# Patient Record
Sex: Female | Born: 1940 | Race: White | Hispanic: No | Marital: Married | Smoking: Never smoker
Health system: Southern US, Community
[De-identification: ages and names within clinical notes are randomized; demographics above are authoritative.]

## PROBLEM LIST (undated history)

## (undated) DIAGNOSIS — H3530 Unspecified macular degeneration: Secondary | ICD-10-CM

## (undated) HISTORY — DX: Unspecified macular degeneration: H35.30

---

## 1993-12-26 HISTORY — PX: CATARACT EXTRACTION W/PHACO: SHX586

## 2009-06-20 ENCOUNTER — Ambulatory Visit: Payer: Self-pay | Admitting: Family Medicine

## 2010-10-27 ENCOUNTER — Ambulatory Visit: Payer: Self-pay | Admitting: Family Medicine

## 2010-10-27 DIAGNOSIS — R059 Cough, unspecified: Secondary | ICD-10-CM | POA: Insufficient documentation

## 2010-10-27 DIAGNOSIS — R05 Cough: Secondary | ICD-10-CM

## 2010-10-27 DIAGNOSIS — J069 Acute upper respiratory infection, unspecified: Secondary | ICD-10-CM | POA: Insufficient documentation

## 2010-10-27 DIAGNOSIS — E785 Hyperlipidemia, unspecified: Secondary | ICD-10-CM | POA: Insufficient documentation

## 2011-01-25 NOTE — Assessment & Plan Note (Signed)
Summary: CHEST COLD (rm 2)   Vital Signs:  Patient Profile:   70 Years Old Female CC:      Cold & URI symptoms  Height:     54 inches Weight:      183 pounds O2 Sat:      96 % O2 treatment:    Room Air Temp:     97.7 degrees F oral Pulse rate:   100 / minute Resp:     14 per minute BP sitting:   122 / 78  (right arm) Cuff size:   regular  Pt. in pain?   no  Vitals Entered By: Lajean Saver RN (October 27, 2010 11:05 AM)                   Updated Prior Medication List: TOPROL XL 50 MG XR24H-TAB (METOPROLOL SUCCINATE)  LIPITOR 20 MG TABS (ATORVASTATIN CALCIUM)  FISH OIL 1000 MG CAPS (OMEGA-3 FATTY ACIDS)  ROBITUSSIN DM 100-10 MG/5ML SYRP (DEXTROMETHORPHAN-GUAIFENESIN)   Current Allergies (reviewed today): ! SULFAHistory of Present Illness Chief Complaint: Cold & URI symptoms  History of Present Illness:  Subjective:  Patient complains of developing a mild cough and URI symptoms on Sept 26.  Symptoms generally improved and she felt better after two weeks, but has had a persistent mild cough ever since.  About 3 or 4 days ago, the cough increased somewhat and she had sweats last night.  No sore throat, and minimal nasal congestion.  Cough is productive and sputum slightly yellow.  No shortness of breath or pleuritic pain.  Not a smoker.  Denies swelling in legs.  REVIEW OF SYSTEMS Constitutional Symptoms       Complains of night sweats.     Denies fever, chills, weight loss, weight gain, and fatigue.  Eyes       Denies change in vision, eye pain, eye discharge, glasses, contact lenses, and eye surgery. Ear/Nose/Throat/Mouth       Complains of hoarseness.      Denies hearing loss/aids, change in hearing, ear pain, ear discharge, dizziness, frequent runny nose, frequent nose bleeds, sinus problems, sore throat, and tooth pain or bleeding.  Respiratory       Complains of dry cough, productive cough, and wheezing.      Denies shortness of breath, asthma, bronchitis, and  emphysema/COPD.  Cardiovascular       Denies murmurs, chest pain, and tires easily with exhertion.    Gastrointestinal       Denies stomach pain, nausea/vomiting, diarrhea, constipation, blood in bowel movements, and indigestion. Genitourniary       Denies painful urination, kidney stones, and loss of urinary control. Neurological       Denies paralysis, seizures, and fainting/blackouts. Musculoskeletal       Denies muscle pain, joint pain, joint stiffness, decreased range of motion, redness, swelling, muscle weakness, and gout.  Skin       Denies bruising, unusual mles/lumps or sores, and hair/skin or nail changes.  Psych       Denies mood changes, temper/anger issues, anxiety/stress, speech problems, depression, and sleep problems. Other Comments: Symptoms began about 1 month ago. They resolved for a week and have returned. Her PCP is recently deceased and is scheduled to see her new PCP in [place].   Past History:  Past Medical History: Irregular heart rate ovarian cysts Hyperlipidemia  Past Surgical History: Cataract extraction Lumpectomy: left  Family History: Reviewed history from 06/20/2009 and no changes required. Family History of CAD  Female 1st degree relative <60 Family History Diabetes 1st degree relative  Social History: Reviewed history from 06/20/2009 and no changes required. Married Never Smoked Alcohol use-no Drug use-no   Objective:  Appearance:  Patient appears healthy, stated age, and in no acute distress  Eyes:  Pupils are equal, round, and reactive to light and accomdation.  Extraocular movement is intact.  Conjunctivae are not inflamed.  Ears:  Canals normal.  Tympanic membranes normal.   Nose:  Normal septum.  Normal turbinates, mildly congested.  Maxillary sinus tenderness present.  Pharynx:  Normal  Neck:  Supple.  No adenopathy is present.  No thyromegaly is present.  No JVD Lungs:  Few minimal rhonchi bilaterally.  Breath sounds are  equal.  Heart:  Regular rate and rhythm without murmurs, rubs, or gallops.  Abdomen:  Nontender without masses or hepatosplenomegaly.  Bowel sounds are present.  No CVA or flank tenderness.  Extremities:  No edema.  Pedal pulses are full and equal.  Skin:  No rash  CHEST - 2 VIEW   Comparison: None.   Findings: The lungs are clear.  Mediastinal contours appear normal. The heart is within upper limits of normal.  No acute bony abnormality is seen.   IMPRESSION: No active lung disease.  Borderline cardiomegaly.   . Assessment New Problems: VIRAL URI (ICD-465.9) COUGH (ICD-786.2) HYPERLIPIDEMIA (ICD-272.4)  SUSPECT RECURRENT VIRAL URI.  NO EVIDENCE CHF AT THIS TIME  Plan New Medications/Changes: AZITHROMYCIN 250 MG TABS (AZITHROMYCIN) Two tabs by mouth on day 1, then 1 tab daily on days 2 through 5  #6 tabs x 0, 10/27/2010, Donna Christen MD  New Orders: T-Chest x-ray, 2 views [71020] Est. Patient Level IV [04540] Planning Comments:   Because of persistent cough, will treat with Z-pack, expectorant, cough suppressant at bedtime.  Recommend flu shot when resolved.  Follow-up with PCP if cough does not improve.   The patient and/or caregiver has been counseled thoroughly with regard to medications prescribed including dosage, schedule, interactions, rationale for use, and possible side effects and they verbalize understanding.  Diagnoses and expected course of recovery discussed and will return if not improved as expected or if the condition worsens. Patient and/or caregiver verbalized understanding.  Prescriptions: AZITHROMYCIN 250 MG TABS (AZITHROMYCIN) Two tabs by mouth on day 1, then 1 tab daily on days 2 through 5  #6 tabs x 0   Entered and Authorized by:   Donna Christen MD   Signed by:   Donna Christen MD on 10/27/2010   Method used:   Print then Give to Patient   RxID:   [id number]   Patient Instructions: 1)  May use Mucinex or Robitussin (guaifenesin)  daily for  congestion, and Robitussin DM at bedtime. 2)  Increase fluid intake, rest. 3)  May use Afrin nasal spray (or generic oxymetazoline) twice daily for about 5 days.  Also recommend using saline nasal spray several times daily and/or saline nasal irrigation. 4)  Followup with family doctor if not improving 7 to 10 days.   Orders Added: 1)  T-Chest x-ray, 2 views [71020] 2)  Est. Patient Level IV [08657]

## 2012-06-19 IMAGING — CR DG CHEST 2V
2 series · 2 of 2 positions shown · non-contrast
Comparison: None.

CLINICAL DATA: Cough, shortness of breath

CHEST - 2 VIEW

[view not recorded (1 of 2)]
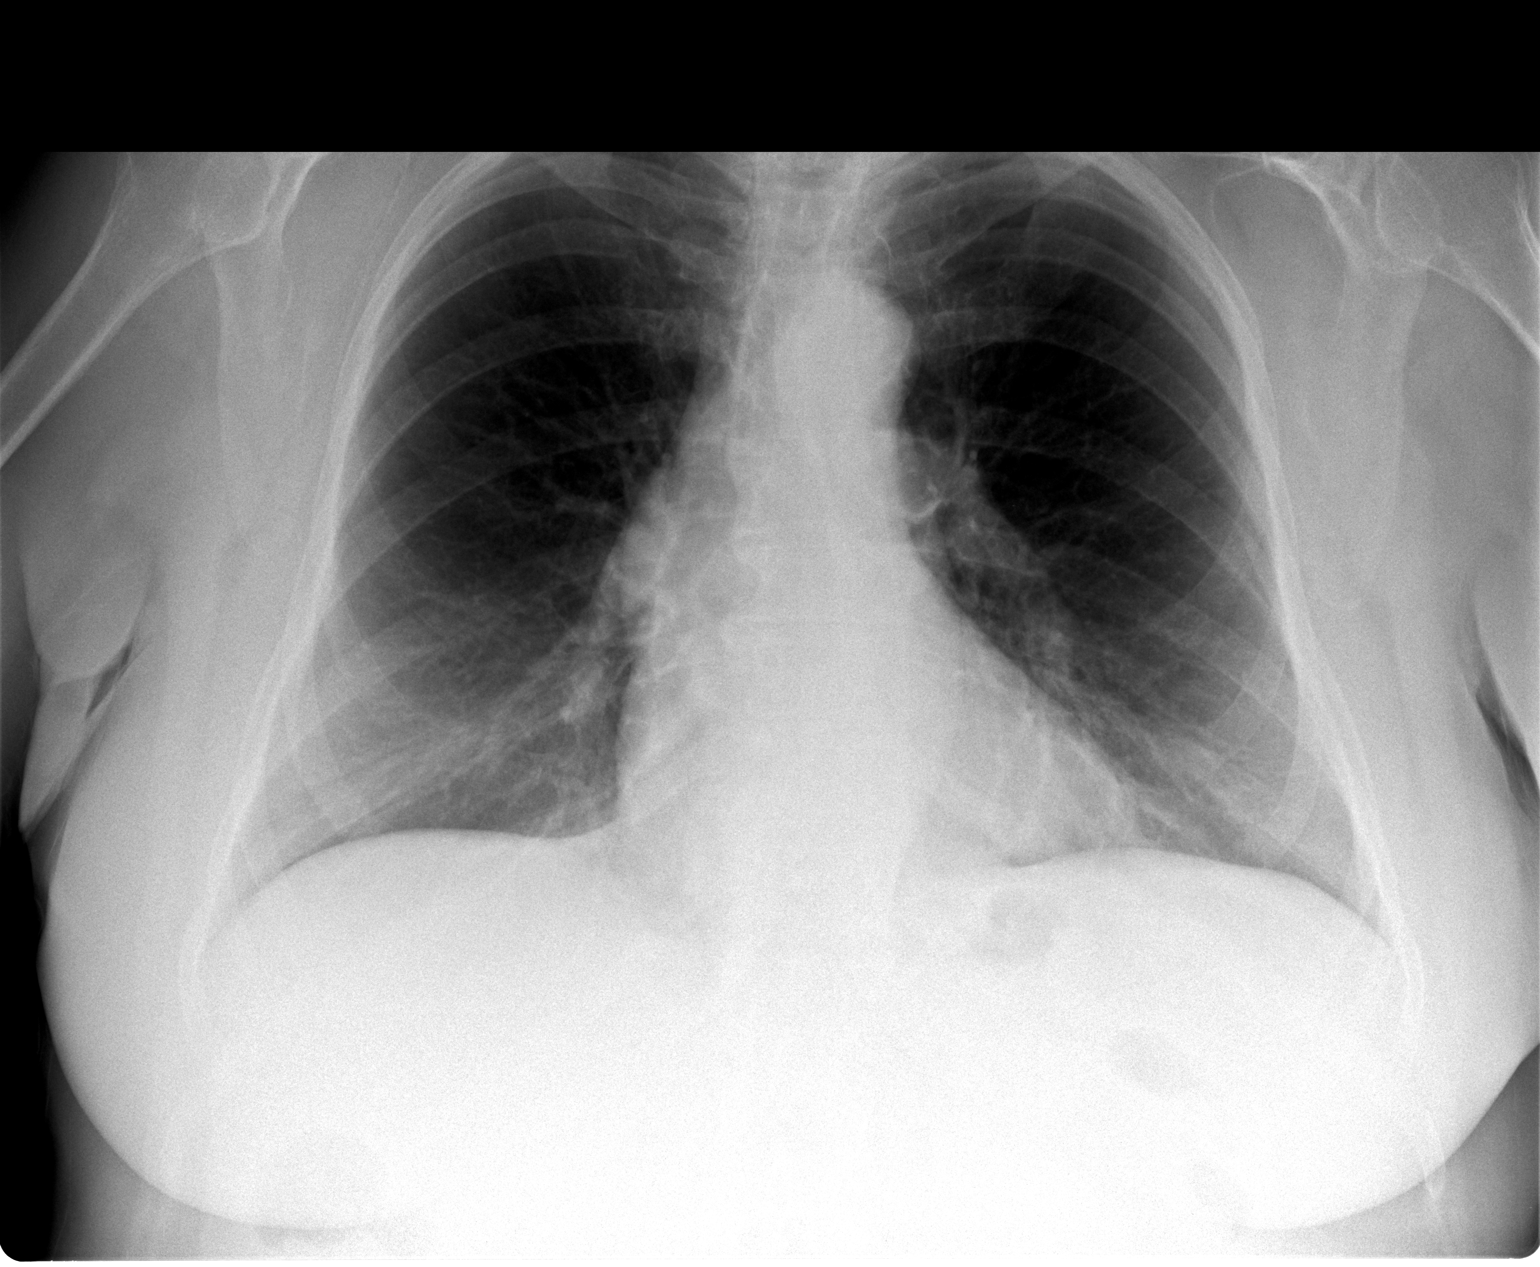

[view not recorded (2 of 2)]
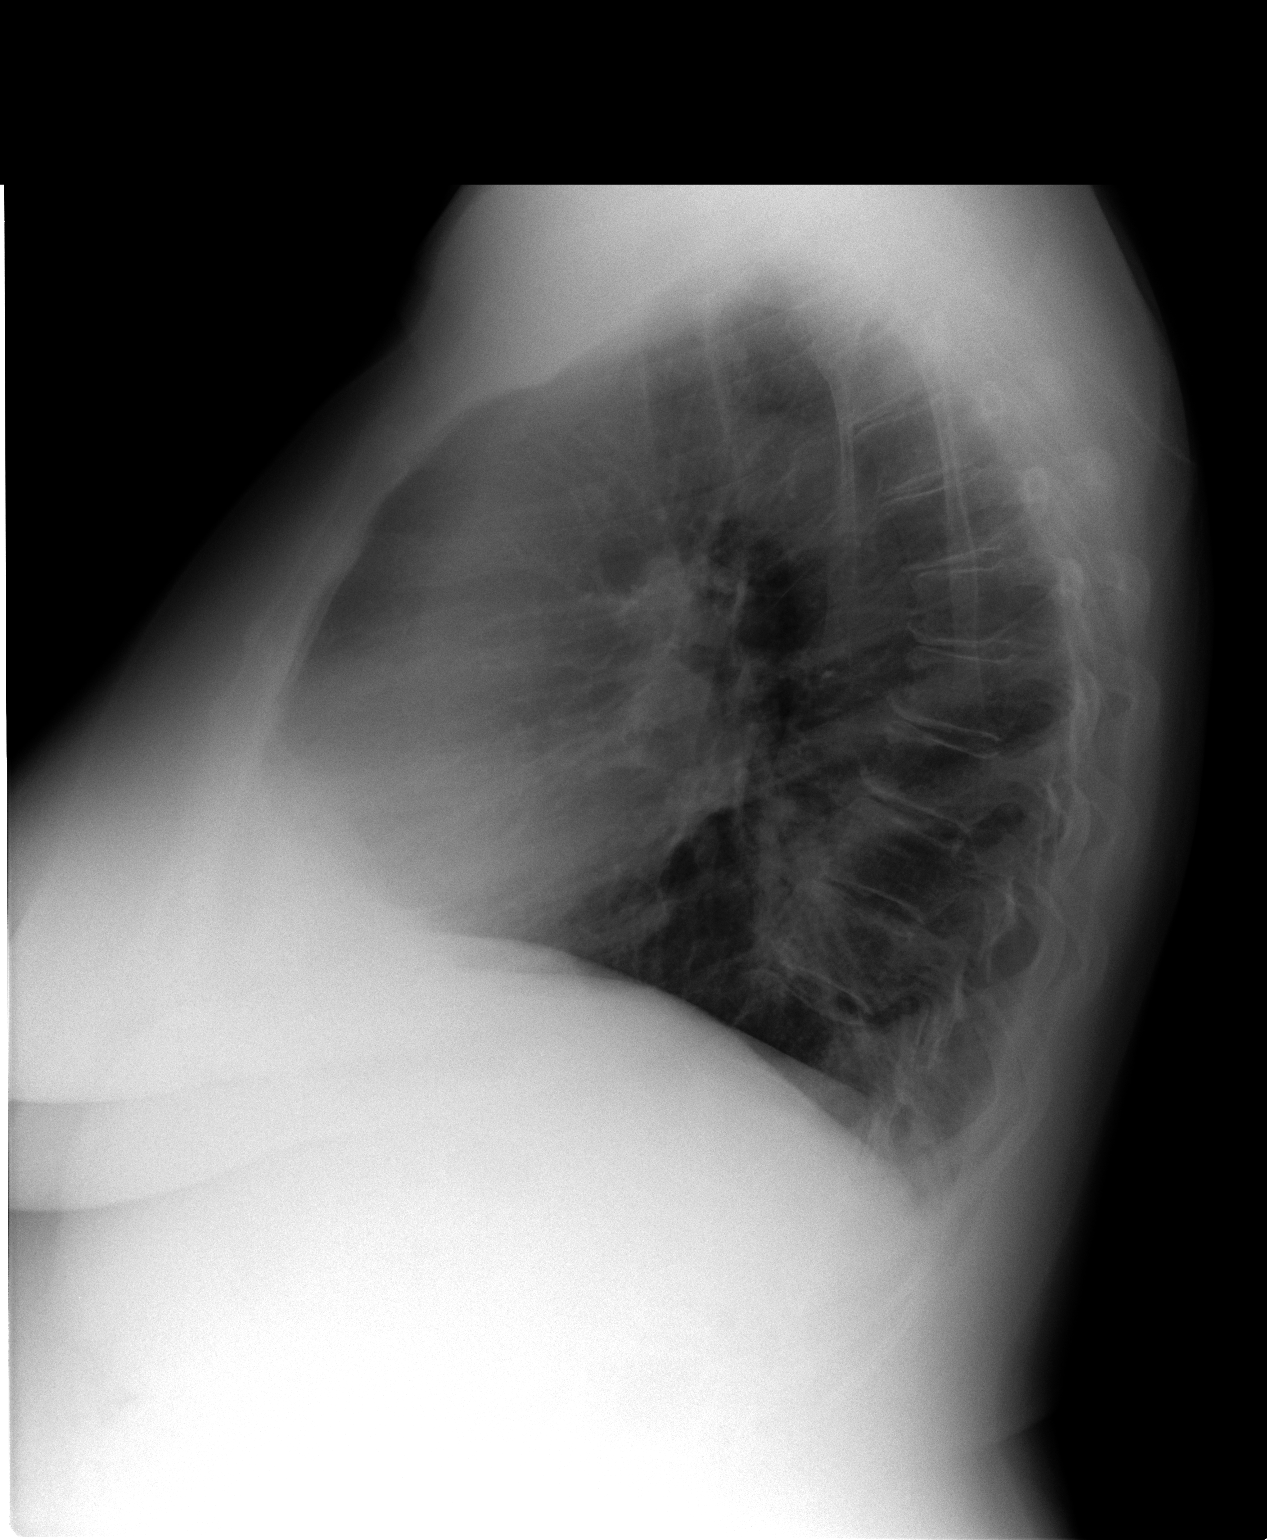

[2 of 2 positions shown; findings below may reference images not displayed]

FINDINGS: The lungs are clear.  Mediastinal contours appear normal.
The heart is within upper limits of normal.  No acute bony
abnormality is seen.
IMPRESSION: No active lung disease.  Borderline cardiomegaly.

## 2013-07-12 ENCOUNTER — Encounter: Payer: Self-pay | Admitting: *Deleted

## 2013-07-15 NOTE — Telephone Encounter (Signed)
This encounter was created in error - please disregard.

## 2020-04-27 ENCOUNTER — Encounter (INDEPENDENT_AMBULATORY_CARE_PROVIDER_SITE_OTHER): Payer: Self-pay | Admitting: Ophthalmology

## 2020-04-27 ENCOUNTER — Ambulatory Visit (INDEPENDENT_AMBULATORY_CARE_PROVIDER_SITE_OTHER): Payer: Medicare HMO | Admitting: Ophthalmology

## 2020-04-27 ENCOUNTER — Other Ambulatory Visit: Payer: Self-pay

## 2020-04-27 DIAGNOSIS — H353223 Exudative age-related macular degeneration, left eye, with inactive scar: Secondary | ICD-10-CM | POA: Diagnosis not present

## 2020-04-27 DIAGNOSIS — H353113 Nonexudative age-related macular degeneration, right eye, advanced atrophic without subfoveal involvement: Secondary | ICD-10-CM

## 2020-04-27 DIAGNOSIS — H35051 Retinal neovascularization, unspecified, right eye: Secondary | ICD-10-CM | POA: Diagnosis not present

## 2020-04-27 DIAGNOSIS — H353114 Nonexudative age-related macular degeneration, right eye, advanced atrophic with subfoveal involvement: Secondary | ICD-10-CM | POA: Insufficient documentation

## 2020-04-27 DIAGNOSIS — H1132 Conjunctival hemorrhage, left eye: Secondary | ICD-10-CM

## 2020-04-27 DIAGNOSIS — H353122 Nonexudative age-related macular degeneration, left eye, intermediate dry stage: Secondary | ICD-10-CM | POA: Diagnosis not present

## 2020-04-27 HISTORY — DX: Conjunctival hemorrhage, left eye: H11.32

## 2020-04-27 NOTE — Patient Instructions (Signed)
Age-Related Macular Degeneration  Age-related macular degeneration (AMD) is an eye disease related to aging. The disease causes a loss of central vision. Central vision allows a person to see objects clearly and do daily tasks like reading and driving. There are two main types of AMD:  Dry AMD. People with this type generally lose their vision slowly. This is the most common type of AMD. Some people with dry AMD notice very little change in their vision as they age.  Wet AMD. People with this type can lose their vision quickly. What are the causes? This condition is caused by damage to the part of the eye that provides you with central vision (macula).  Dry AMD happens when deposits in the macula cause light-sensitive cells to slowly break down.  Wet AMD happens when abnormal blood vessels grow under the macula and leak blood and fluid. What increases the risk? You are more likely to develop this condition if you:  Are 50 years old or older, and especially 75 years old or older.  Smoke.  Are obese.  Have a family history of AMD.  Have high cholesterol, high blood pressure, or heart disease.  Have been exposed to high levels of ultraviolet (UV) light and blue light.  Are white (Caucasian).  Are female. What are the signs or symptoms? Common symptoms of this condition include:  Blurred vision, especially when reading print material. The blurred vision often improves in brighter light.  A blurred or blind spot in the center of your field of vision that is small but growing larger.  Bright colors seeming less bright than they used to be.  Decreased ability to recognize and see faces.  One eye seeing worse than the other.  Decreased ability to adapt to dimly lit rooms.  Straight lines appearing crooked or wavy. How is this diagnosed? This condition is diagnosed based on your symptoms and an eye exam. During the eye exam:  Eye drops will be placed into your eyes to  enlarge (dilate) your pupils. This will allow your health care provider to see the back of your eye.  You may be asked to look at an image that looks like a checkerboard (Amsler grid). Early changes in your central vision may cause the grid to appear distorted. After the exam, you may be given one or both of these tests:  Fluorescein angiogram. This test determines whether you have dry or wet AMD.  Optical coherence tomography (OCT) test to evaluate deep layers of the retina. How is this treated? There is no cure for this condition, but treatment can help to slow down progression of the disease. This condition may be treated with:  Supplements, including vitamin C, vitamin E, beta carotene, and zinc.  Laser surgery to destroy new blood vessels or leaking blood vessels in your eye.  Injections of medicines into your eye to slow down the formation of abnormal blood vessels that may leak. These injections may need to be repeated on a routine basis. Follow these instructions at home:  Take over-the-counter and prescription medicines only as told by your health care provider.  Take vitamins and supplements as told by your health care provider.  Ask your health care provider for an Amsler grid. Use it every day to check each eye for vision changes.  Get an eye exam as often as told by your health care provider. Make sure to get an eye exam at least once every year.  Keep all follow-up visits as told by   your health care provider. This is important. Contact a health care provider if:  You notice any new changes in your vision. Get help right away if:  You suddenly lose vision or develop pain in the eye. Summary  Age-related macular degeneration (AMD) is an eye disease related to aging. There are two types of this condition: dry AMD and wet AMD.  This condition is caused by damage to the part of the eye that provides you with central vision (macula).  Once diagnosed with AMD, make sure  to get an eye exam every year, take supplements and vitamins as directed, use an Amsler grid at home, and follow up with your health care provider. This information is not intended to replace advice given to you by your health care provider. Make sure you discuss any questions you have with your health care provider. Document Revised: 06/20/2018 Document Reviewed: 06/20/2018 [employer] Patient Education  2020 [employer]  

## 2020-04-27 NOTE — Assessment & Plan Note (Signed)
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
## 2020-06-08 ENCOUNTER — Encounter (INDEPENDENT_AMBULATORY_CARE_PROVIDER_SITE_OTHER): Payer: Self-pay | Admitting: Ophthalmology

## 2020-10-28 ENCOUNTER — Other Ambulatory Visit: Payer: Self-pay

## 2020-10-28 ENCOUNTER — Ambulatory Visit (INDEPENDENT_AMBULATORY_CARE_PROVIDER_SITE_OTHER): Payer: Medicare HMO | Admitting: Ophthalmology

## 2020-10-28 ENCOUNTER — Encounter (INDEPENDENT_AMBULATORY_CARE_PROVIDER_SITE_OTHER): Payer: Self-pay | Admitting: Ophthalmology

## 2020-10-28 DIAGNOSIS — H353114 Nonexudative age-related macular degeneration, right eye, advanced atrophic with subfoveal involvement: Secondary | ICD-10-CM

## 2020-10-28 DIAGNOSIS — H353113 Nonexudative age-related macular degeneration, right eye, advanced atrophic without subfoveal involvement: Secondary | ICD-10-CM

## 2020-10-28 DIAGNOSIS — H353223 Exudative age-related macular degeneration, left eye, with inactive scar: Secondary | ICD-10-CM

## 2020-10-28 DIAGNOSIS — H353122 Nonexudative age-related macular degeneration, left eye, intermediate dry stage: Secondary | ICD-10-CM

## 2020-10-28 NOTE — Assessment & Plan Note (Signed)
Accounts for acuity OD, no signs of CNVM

## 2020-10-28 NOTE — Assessment & Plan Note (Signed)
No signs of CNVM recurrence OS

## 2020-10-28 NOTE — Patient Instructions (Signed)
Patient may follow-up in [place] with [facility] covering Dr. Philis Fendt or one of his partners as she may be moving in that locale

## 2020-10-28 NOTE — Progress Notes (Signed)
10/28/2020     CHIEF COMPLAINT Patient presents for Retina Follow Up   HISTORY OF PRESENT ILLNESS: Gina Cohen is a 79 y.o. female who presents to the clinic today for:   HPI    Retina Follow Up    Patient presents with  Dry AMD.  In both eyes.  Severity is moderate.  Duration of 6 months.  Since onset it is stable.  I, the attending physician,  performed the HPI with the patient and updated documentation appropriately.          Comments    6 Month Dry AMD f\u OU. OCT  Pt states no changes in vision. Denies FOL and floaters.       Last edited by Elyse Jarvis on 10/28/2020 10:48 AM. (History)      Referring physician: No referring provider defined for this encounter.  HISTORICAL INFORMATION:   Selected notes from the MEDICAL RECORD NUMBER       CURRENT MEDICATIONS: No current outpatient medications on file. (Ophthalmic Drugs)   No current facility-administered medications for this visit. (Ophthalmic Drugs)   Current Outpatient Medications (Other)  Medication Sig   anastrozole (ARIMIDEX) 1 MG tablet Take by mouth.   calcium-vitamin D (OSCAL WITH D) 500-200 MG-UNIT TABS tablet Take by mouth.   co-enzyme Q-10 30 MG capsule Take by mouth.   cyanocobalamin 1000 MCG tablet Take by mouth.   denosumab (PROLIA) 60 MG/ML SOSY injection Inject into the skin.   estradiol (ESTRACE) 0.1 MG/GM vaginal cream Place a pea-sized amount in the vagina every night for 3 weeks then every other night after that   flecainide (TAMBOCOR) 100 MG tablet Take by mouth.   fluocinonide cream (LIDEX) 0.05 % APPLY TO ITCHING AREA ON LEG ONCE PER DAY UNDER OCCLUSION, NOT TO FACE   hydrocortisone 2.5 % cream Apply 1-2 times daily for 2 weeks, for hemorrhoid.   metoprolol succinate (TOPROL-XL) 25 MG 24 hr tablet Take by mouth.   pravastatin (PRAVACHOL) 80 MG tablet Take by mouth.   sodium chloride (OCEAN) 0.65 % nasal spray Place into the nose.   No current  facility-administered medications for this visit. (Other)      REVIEW OF SYSTEMS:    ALLERGIES Allergies  Allergen Reactions   Sulfonamide Derivatives    Tobramycin     PAST MEDICAL HISTORY Past Medical History:  Diagnosis Date   Macular degeneration    Past Surgical History:  Procedure Laterality Date   CATARACT EXTRACTION W/PHACO Bilateral 1995    FAMILY HISTORY Family History  Problem Relation Age of Onset   Macular degeneration Mother    Diabetes Mother    Hypertension Mother    Macular degeneration Maternal Grandmother    Diabetes Maternal Grandmother     SOCIAL HISTORY Social History   Tobacco Use   Smoking status: Never Smoker   Smokeless tobacco: Never Used  Substance Use Topics   Alcohol use: Not on file   Drug use: Not on file         OPHTHALMIC EXAM: Base Eye Exam    Visual Acuity (Snellen - Linear)      Right Left   Dist [place] 20/400 20/30 +   Dist ph [place] 20/200        Tonometry (Tonopen, 10:52 AM)      Right Left   Pressure 13 16       Pupils      Pupils Dark Light Shape React APD   Right PERRL  4 3 Round Brisk None   Left PERRL 4 3 Round Brisk None       Visual Fields (Counting fingers)      Left Right    Full Full       Neuro/Psych    Oriented x3: Yes   Mood/Affect: Normal       Dilation    Both eyes: 1.0% Mydriacyl, 2.5% Phenylephrine @ 10:52 AM        Slit Lamp and Fundus Exam    External Exam      Right Left   External Normal Normal       Slit Lamp Exam      Right Left   Lids/Lashes Normal Normal   Conjunctiva/Sclera White and quiet 1+ Subconjunctival hemorrhage, inferonasal quadrant, nonbullous   Cornea Clear Clear   Anterior Chamber Deep and quiet Deep and quiet   Iris Round and reactive Round and reactive   Lens Posterior chamber intraocular lens Posterior chamber intraocular lens   Anterior Vitreous Normal Normal       Fundus Exam      Right Left   Posterior Vitreous Normal Normal    Disc Normal Normal   C/D Ratio 0.6 0.6   Macula Advanced age related macular degeneration, Macular atrophy, Geographic atrophy in the FAZ Advanced age related macular degeneration, Intermediate age related macular degeneration, Retinal pigment epithelial atrophy, Geographic atrophy peri around the fovea, Drusen, Hard drusen, no exudates, no macular thickening, Retinal pigment epithelial mottling   Vessels Normal Normal   Periphery Normal Normal          IMAGING AND PROCEDURES  Imaging and Procedures for 10/28/20  OCT, Retina - OU - Both Eyes       Right Eye Quality was good. Scan locations included subfoveal. Central Foveal Thickness: 255. Progression has been stable. Findings include normal foveal contour, no SRF, central retinal atrophy, outer retinal atrophy, subretinal scarring.   Left Eye Quality was good. Scan locations included subfoveal. Central Foveal Thickness: 274. Progression has been stable. Findings include normal foveal contour, no SRF, outer retinal atrophy, central retinal atrophy, inner retinal atrophy.   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 Intermediate stage nonexudative age-related macular degeneration of left eye  H35.3122 OCT, Retina - OU - Both Eyes  3. 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  No signs of active wet AMD  OU.  2.  Atrophy central fovea  OD accounts for acuity, no change  3.  Ophthalmic Meds Ordered this visit:  No orders of the defined types were placed in this encounter.      Return in about 9 months (around 07/28/2021) for DILATE OU, OCT.  Patient Instructions  Patient may follow-up in [place] with [facility] covering Dr. Philis Fendt or one of his partners as she may be moving  in that locale    Explained the diagnoses, plan, and follow up with the patient and they expressed understanding.  Patient expressed understanding of the importance of proper follow up care.   Alford Highland Madoc Holquin M.D. Diseases & Surgery of the Retina and Vitreous [facility] 10/28/20     Abbreviations: M myopia (nearsighted); A astigmatism; H hyperopia (farsighted); P presbyopia; Mrx spectacle prescription;  CTL contact lenses; OD right eye; OS left eye; OU both eyes  XT exotropia; ET esotropia; PEK punctate epithelial keratitis; PEE punctate epithelial erosions; DES dry eye syndrome; MGD meibomian gland dysfunction; ATs artificial tears; PFAT's preservative free artificial tears; NSC nuclear sclerotic cataract; PSC posterior subcapsular cataract; ERM epi-retinal membrane; PVD posterior vitreous detachment; RD retinal detachment; DM diabetes mellitus; DR diabetic retinopathy; NPDR non-proliferative diabetic retinopathy; PDR proliferative diabetic retinopathy; CSME clinically significant macular edema; DME diabetic macular edema; dbh dot blot hemorrhages; CWS cotton wool spot; POAG primary open angle glaucoma; C/D cup-to-disc ratio; HVF humphrey visual field; GVF goldmann visual field; OCT optical coherence tomography; IOP intraocular pressure; BRVO Branch retinal vein occlusion; CRVO central retinal vein occlusion; CRAO central retinal artery occlusion; BRAO branch retinal artery occlusion; RT retinal tear; SB scleral buckle; PPV pars plana vitrectomy; VH Vitreous hemorrhage; PRP panretinal laser  photocoagulation; IVK intravitreal kenalog; VMT vitreomacular traction; MH Macular hole;  NVD neovascularization of the disc; NVE neovascularization elsewhere; AREDS age related eye disease study; ARMD age related macular degeneration; POAG primary open angle glaucoma; EBMD epithelial/anterior basement membrane dystrophy; ACIOL anterior chamber intraocular lens; IOL intraocular lens; PCIOL posterior chamber intraocular lens; Phaco/IOL phacoemulsification with intraocular lens placement; PRK photorefractive keratectomy; LASIK laser assisted in situ keratomileusis; HTN hypertension; DM diabetes mellitus; COPD chronic obstructive pulmonary disease

## 2021-07-28 ENCOUNTER — Encounter (INDEPENDENT_AMBULATORY_CARE_PROVIDER_SITE_OTHER): Payer: Medicare HMO | Admitting: Ophthalmology

## 2021-08-31 ENCOUNTER — Encounter (INDEPENDENT_AMBULATORY_CARE_PROVIDER_SITE_OTHER): Payer: Self-pay | Admitting: Ophthalmology

## 2021-08-31 ENCOUNTER — Other Ambulatory Visit: Payer: Self-pay

## 2021-08-31 ENCOUNTER — Ambulatory Visit (INDEPENDENT_AMBULATORY_CARE_PROVIDER_SITE_OTHER): Payer: Medicare HMO | Admitting: Ophthalmology

## 2021-08-31 DIAGNOSIS — H353114 Nonexudative age-related macular degeneration, right eye, advanced atrophic with subfoveal involvement: Secondary | ICD-10-CM | POA: Diagnosis not present

## 2021-08-31 DIAGNOSIS — H353223 Exudative age-related macular degeneration, left eye, with inactive scar: Secondary | ICD-10-CM | POA: Diagnosis not present

## 2021-08-31 DIAGNOSIS — H353122 Nonexudative age-related macular degeneration, left eye, intermediate dry stage: Secondary | ICD-10-CM | POA: Diagnosis not present

## 2021-08-31 DIAGNOSIS — H353113 Nonexudative age-related macular degeneration, right eye, advanced atrophic without subfoveal involvement: Secondary | ICD-10-CM

## 2021-08-31 NOTE — Progress Notes (Signed)
08/31/2021     CHIEF COMPLAINT Patient presents for  Chief Complaint  Patient presents with   Macular Degeneration      HISTORY OF PRESENT ILLNESS: Gina Cohen is a 80 y.o. 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  No interval changes by patient.  No visual acuity distortions or visual loss in either eye Last edited by Edmon Crape, MD on 08/31/2021 10:22 AM.      Referring physician: No referring provider defined for this encounter.  HISTORICAL INFORMATION:   Selected notes from the MEDICAL RECORD NUMBER       CURRENT MEDICATIONS: No current outpatient medications on file. (Ophthalmic Drugs)   No current facility-administered medications for this visit. (Ophthalmic Drugs)   Current Outpatient Medications (Other)  Medication Sig   anastrozole (ARIMIDEX) 1 MG tablet Take by mouth.   calcium-vitamin D (OSCAL WITH D) 500-200 MG-UNIT TABS tablet Take by mouth.   co-enzyme Q-10 30 MG capsule Take by mouth.   cyanocobalamin 1000 MCG tablet Take by mouth.   denosumab (PROLIA) 60 MG/ML SOSY injection Inject into the skin.   estradiol (ESTRACE) 0.1 MG/GM vaginal cream Place a pea-sized amount in the vagina every night for 3 weeks then every other night after that   flecainide (TAMBOCOR) 100 MG tablet Take by mouth.   fluocinonide cream (LIDEX) 0.05 % APPLY TO ITCHING AREA ON LEG ONCE PER DAY UNDER OCCLUSION, NOT TO FACE   hydrocortisone 2.5 % cream Apply 1-2 times daily for 2 weeks, for hemorrhoid.   metoprolol succinate (TOPROL-XL) 25 MG 24 hr tablet Take by mouth.   pravastatin (PRAVACHOL) 80 MG tablet Take by mouth.   sodium chloride (OCEAN) 0.65 % nasal spray Place into the nose.   No current facility-administered medications for this visit. (Other)      REVIEW OF SYSTEMS:    ALLERGIES Allergies  Allergen Reactions   Sulfonamide Derivatives    Tobramycin     PAST MEDICAL  HISTORY Past Medical History:  Diagnosis Date   Macular degeneration    Subconjunctival hemorrhage of left eye 04/27/2020   Past Surgical History:  Procedure Laterality Date   CATARACT EXTRACTION W/PHACO Bilateral 1995    FAMILY HISTORY Family History  Problem Relation Age of Onset   Macular degeneration Mother    Diabetes Mother    Hypertension Mother    Macular degeneration Maternal Grandmother    Diabetes Maternal Grandmother     SOCIAL HISTORY Social History   Tobacco Use   Smoking status: Never   Smokeless tobacco: Never         OPHTHALMIC EXAM:  Base Eye Exam     Visual Acuity (ETDRS)       Right Left   Dist [place] 20/300 20/25         Tonometry (Tonopen, 10:20 AM)       Right Left   Pressure 15 14         Pupils       Pupils React APD   Right PERRL Brisk None   Left PERRL Brisk None         Visual Fields       Left Right    Full    Restrictions  Central scotoma         Neuro/Psych     Oriented x3: Yes   Mood/Affect: Normal  Slit Lamp and Fundus Exam     External Exam       Right Left   External Normal Normal         Slit Lamp Exam       Right Left   Lids/Lashes Normal Normal   Conjunctiva/Sclera White and quiet White and quiet   Cornea Clear Clear   Anterior Chamber Deep and quiet Deep and quiet   Iris Round and reactive Round and reactive   Lens Posterior chamber intraocular lens Posterior chamber intraocular lens   Anterior Vitreous Normal Normal         Fundus Exam       Right Left   Posterior Vitreous Normal Normal   Disc Normal Normal   C/D Ratio 0.6 0.6   Macula Advanced age related macular degeneration, Macular atrophy, Geographic atrophy in the FAZ Advanced age related macular degeneration, Intermediate age related macular degeneration, Retinal pigment epithelial atrophy, Geographic atrophy peri around the fovea, Drusen, Hard drusen, no exudates, no macular thickening, Retinal pigment  epithelial mo, , ttling   Vessels Normal Normal   Periphery Normal Normal            IMAGING AND PROCEDURES  Imaging and Procedures for 08/31/21  OCT, Retina - OU - Both Eyes       Right Eye Quality was good. Scan locations included subfoveal. Central Foveal Thickness: 255. Progression has been stable. Findings include normal foveal contour, no SRF, central retinal atrophy, outer retinal atrophy, subretinal scarring.   Left Eye Quality was good. Scan locations included subfoveal. Central Foveal Thickness: 291. Progression has been stable. Findings include normal foveal contour, no SRF, outer retinal atrophy, central retinal atrophy, inner retinal atrophy.   Notes No no active maculopathy OU, no signs of CNVM OU             ASSESSMENT/PLAN:  Advanced nonexudative age-related macular degeneration of right eye with subfoveal involvement The nature of dry age related macular degeneration was discussed with the patient as well as its possible conversion to wet. The results of the AREDS 2 study was discussed with the patient. A diet rich in dark leafy green vegetables was advised and specific recommendations were made regarding supplements with AREDS 2 formulation . Control of hypertension and serum cholesterol may slow the disease. Smoking cessation is mandatory to slow the disease and diminish the risk of progressing to wet age related macular degeneration. The patient was instructed in the use of an [place]ler Grid and was told to return immediately for any changes in the Grid. 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 Advanced nonexudative age-related macular degeneration of right eye without subfoveal involvement  H35.3113 OCT, Retina - OU - Both Eyes     2. Intermediate stage nonexudative age-related macular degeneration of left eye  H35.3122 OCT, Retina - OU - Both Eyes    3. Advanced nonexudative age-related macular degeneration of right eye with subfoveal involvement  H35.3114     4. 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  No interval change over the last 9 months, follow-up again in 9 months  2.  Stable geographic atrophy OD limits acuity  3.  Advanced intermediate ARMD OS yet no sign of CNVM will observe  Ophthalmic Meds Ordered this visit:  No orders of the defined types were placed in this encounter.      Return in about 9 months (around 05/31/2022) for DILATE OU, COLOR FP, OCT, OD, OS.  There are no Patient Instructions on file for this visit.   Explained the diagnoses, plan, and follow up with the patient and they expressed understanding.  Patient expressed understanding of the importance of proper follow up care.   Alford Highland Claudia Alvizo M.D. Diseases & Surgery of the Retina and Vitreous [facility] 08/31/21     Abbreviations: M myopia (nearsighted); A astigmatism; H hyperopia (farsighted); P presbyopia; Mrx spectacle prescription;  CTL contact lenses; OD right eye; OS left eye; OU both eyes  XT exotropia; ET esotropia; PEK punctate epithelial keratitis; PEE punctate epithelial erosions; DES dry eye syndrome; MGD meibomian gland dysfunction; ATs artificial tears; PFAT's preservative free artificial tears; NSC nuclear sclerotic cataract; PSC posterior subcapsular cataract; ERM epi-retinal membrane; PVD posterior vitreous detachment; RD retinal detachment; DM diabetes mellitus; DR diabetic retinopathy; NPDR non-proliferative diabetic retinopathy; PDR proliferative diabetic retinopathy; CSME clinically significant macular edema; DME diabetic macular edema; dbh dot blot hemorrhages; CWS cotton wool spot; POAG primary open angle glaucoma; C/D cup-to-disc  ratio; HVF humphrey visual field; GVF goldmann visual field; OCT optical coherence tomography; IOP intraocular pressure; BRVO Branch retinal vein occlusion; CRVO central retinal vein occlusion; CRAO central retinal artery occlusion; BRAO branch retinal artery occlusion; RT retinal tear; SB scleral buckle; PPV pars plana vitrectomy; VH Vitreous hemorrhage; PRP panretinal laser photocoagulation; IVK intravitreal kenalog; VMT vitreomacular traction; MH Macular hole;  NVD neovascularization of the disc; NVE neovascularization elsewhere; AREDS age related eye disease study; ARMD age related macular degeneration; POAG primary open angle glaucoma; EBMD epithelial/anterior basement membrane dystrophy; ACIOL anterior chamber intraocular lens; IOL intraocular lens; PCIOL posterior chamber intraocular lens; Phaco/IOL phacoemulsification with intraocular lens placement; PRK photorefractive keratectomy; LASIK laser assisted in situ keratomileusis; HTN hypertension; DM diabetes mellitus; COPD chronic obstructive pulmonary disease

## 2021-08-31 NOTE — Assessment & Plan Note (Signed)
No signs of CNVM today OS

## 2021-08-31 NOTE — Assessment & Plan Note (Signed)
Stable and in active disease

## 2021-08-31 NOTE — Assessment & Plan Note (Signed)

## 2022-05-31 ENCOUNTER — Encounter (INDEPENDENT_AMBULATORY_CARE_PROVIDER_SITE_OTHER): Payer: Self-pay | Admitting: Ophthalmology

## 2022-05-31 ENCOUNTER — Ambulatory Visit (INDEPENDENT_AMBULATORY_CARE_PROVIDER_SITE_OTHER): Payer: Medicare HMO | Admitting: Ophthalmology

## 2022-05-31 DIAGNOSIS — H353113 Nonexudative age-related macular degeneration, right eye, advanced atrophic without subfoveal involvement: Secondary | ICD-10-CM | POA: Diagnosis not present

## 2022-05-31 DIAGNOSIS — H353114 Nonexudative age-related macular degeneration, right eye, advanced atrophic with subfoveal involvement: Secondary | ICD-10-CM | POA: Diagnosis not present

## 2022-05-31 DIAGNOSIS — H353123 Nonexudative age-related macular degeneration, left eye, advanced atrophic without subfoveal involvement: Secondary | ICD-10-CM | POA: Diagnosis not present

## 2022-05-31 NOTE — Assessment & Plan Note (Signed)
Large increased size overall but stable over the last 9 months subfoveal atrophy

## 2022-05-31 NOTE — Progress Notes (Signed)
05/31/2022     CHIEF COMPLAINT Patient presents for  Chief Complaint  Patient presents with   Macular Degeneration      HISTORY OF PRESENT ILLNESS: Gina Cohen is a 81 y.o. female who presents to the clinic today for:   HPI   9 mos fu OU OCT FP. Patient states "My vision seems like it is not as good as it was, but just generally speaking." Denies new FOL or floaters. Allergy to Tobramycin. Last edited by Nelva Nay on 05/31/2022  9:37 AM.      Referring physician: No referring provider defined for this encounter.  HISTORICAL INFORMATION:   Selected notes from the MEDICAL RECORD NUMBER       CURRENT MEDICATIONS: No current outpatient medications on file. (Ophthalmic Drugs)   No current facility-administered medications for this visit. (Ophthalmic Drugs)   Current Outpatient Medications (Other)  Medication Sig   anastrozole (ARIMIDEX) 1 MG tablet Take by mouth.   calcium-vitamin D (OSCAL WITH D) 500-200 MG-UNIT TABS tablet Take by mouth.   co-enzyme Q-10 30 MG capsule Take by mouth.   cyanocobalamin 1000 MCG tablet Take by mouth.   denosumab (PROLIA) 60 MG/ML SOSY injection Inject into the skin.   estradiol (ESTRACE) 0.1 MG/GM vaginal cream Place a pea-sized amount in the vagina every night for 3 weeks then every other night after that   flecainide (TAMBOCOR) 100 MG tablet Take by mouth.   fluocinonide cream (LIDEX) 0.05 % APPLY TO ITCHING AREA ON LEG ONCE PER DAY UNDER OCCLUSION, NOT TO FACE   hydrocortisone 2.5 % cream Apply 1-2 times daily for 2 weeks, for hemorrhoid.   metoprolol succinate (TOPROL-XL) 25 MG 24 hr tablet Take by mouth.   pravastatin (PRAVACHOL) 80 MG tablet Take by mouth.   sodium chloride (OCEAN) 0.65 % nasal spray Place into the nose.   No current facility-administered medications for this visit. (Other)      REVIEW OF SYSTEMS:    ALLERGIES Allergies  Allergen Reactions   Sulfonamide Derivatives    Tobramycin     PAST  MEDICAL HISTORY Past Medical History:  Diagnosis Date   Macular degeneration    Subconjunctival hemorrhage of left eye 04/27/2020   Past Surgical History:  Procedure Laterality Date   CATARACT EXTRACTION W/PHACO Bilateral 1995    FAMILY HISTORY Family History  Problem Relation Age of Onset   Macular degeneration Mother    Diabetes Mother    Hypertension Mother    Macular degeneration Maternal Grandmother    Diabetes Maternal Grandmother     SOCIAL HISTORY Social History   Tobacco Use   Smoking status: Never   Smokeless tobacco: Never         OPHTHALMIC EXAM:  Base Eye Exam     Visual Acuity (ETDRS)       Right Left   Dist [place] 20/250 20/25 -1   Dist ph [place] NI 9:41 AM         Tonometry (Tonopen, 9:42 AM)       Right Left   Pressure 15 17         Pupils       Pupils Dark Light APD   Right PERRL 4 3 None   Left PERRL 4 3 None         Visual Fields (Counting fingers)       Left Right    Full    Restrictions  Partial inner superior temporal, inferior temporal, superior nasal, inferior  nasal deficiencies         Extraocular Movement       Right Left    Full Full         Neuro/Psych     Oriented x3: Yes   Mood/Affect: Normal         Dilation     Both eyes: 1.0% Mydriacyl, 2.5% Phenylephrine @ 9:41 AM           Slit Lamp and Fundus Exam     External Exam       Right Left   External Normal Normal         Slit Lamp Exam       Right Left   Lids/Lashes Normal Normal   Conjunctiva/Sclera White and quiet White and quiet   Cornea Clear Clear   Anterior Chamber Deep and quiet Deep and quiet   Iris Round and reactive Round and reactive   Lens Posterior chamber intraocular lens Posterior chamber intraocular lens   Anterior Vitreous Normal Normal         Fundus Exam       Right Left   Posterior Vitreous Normal Normal   Disc Normal Normal   C/D Ratio 0.6 0.6   Macula Advanced age related macular degeneration, Macular  atrophy, Geographic atrophy in the FAZ Advanced age related macular degeneration, Intermediate age related macular degeneration, Retinal pigment epithelial atrophy, Geographic atrophy peri around the fovea, Drusen, Hard drusen, no exudates, no macular thickening, Retinal pigment epithelial mo, , ttling   Vessels Normal Normal   Periphery Normal Normal            IMAGING AND PROCEDURES  Imaging and Procedures for 05/31/22  OCT, Retina - OU - Both Eyes       Right Eye Quality was good. Scan locations included subfoveal. Central Foveal Thickness: 288. Progression has been stable. Findings include normal foveal contour, no SRF, central retinal atrophy, outer retinal atrophy, subretinal scarring.   Left Eye Quality was good. Scan locations included subfoveal. Central Foveal Thickness: 274. Progression has been stable. Findings include normal foveal contour, no SRF, outer retinal atrophy, central retinal atrophy, inner retinal atrophy.   Notes No no active maculopathy OU, no signs of CNVM OU     Color Fundus Photography Optos - OU - Both Eyes       Right Eye Progression has no prior data. Disc findings include increased cup to disc ratio. Macula : geographic atrophy. Vessels : normal observations. Periphery : normal observations.   Left Eye Progression has no prior data. Disc findings include increased cup to disc ratio. Macula : geographic atrophy. Vessels : normal observations. Periphery : normal observations.   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  Likely candidate for Syfovre next visit      ICD-10-CM   1. Advanced nonexudative age-related macular degeneration of right eye without subfoveal involvement  H35.3113 OCT, Retina - OU -  Both Eyes    Color Fundus Photography Optos - OU - Both Eyes    2. Advanced nonexudative age-related macular degeneration of right eye with subfoveal involvement  H35.3114     3. 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  Dry AMD OU, progressive over time.  Subfoveal involvement right eye accounts for acuity  2.  OS with demonstrable geographic atrophy progression over the last 6 years by OCT red free likely candidate for Syfovre upon his availability sometime after October 2023  3.  Ophthalmic Meds Ordered this visit:  No orders of the defined types were placed in this encounter.      Return in about 5 months (around 10/31/2022) for DILATE OU, COLOR FP, OCT,, discussed use of Syfovre OS.  There are no Patient Instructions on file for this visit.   Explained the diagnoses, plan, and follow up with the patient and they expressed understanding.  Patient expressed understanding of the importance of proper follow up care.   Alford HighlandGary A. Inez Rosato M.D. Diseases & Surgery of the Retina and Vitreous [facility] 05/31/22     Abbreviations: M myopia (nearsighted); A astigmatism; H hyperopia (farsighted); P presbyopia; Mrx spectacle prescription;  CTL contact lenses; OD right eye; OS left eye; OU both eyes  XT exotropia; ET esotropia; PEK punctate epithelial keratitis; PEE punctate epithelial erosions; DES dry eye syndrome; MGD meibomian gland dysfunction; ATs artificial tears; PFAT's preservative free artificial tears; NSC nuclear sclerotic  cataract; PSC posterior subcapsular cataract; ERM epi-retinal membrane; PVD posterior vitreous detachment; RD retinal detachment; DM diabetes mellitus; DR diabetic retinopathy; NPDR non-proliferative diabetic retinopathy; PDR proliferative diabetic retinopathy; CSME clinically significant macular edema; DME diabetic macular edema; dbh dot blot hemorrhages; CWS cotton wool spot; POAG primary open angle glaucoma; C/D cup-to-disc ratio; HVF humphrey visual field; GVF goldmann visual field; OCT optical coherence tomography; IOP intraocular pressure; BRVO Branch retinal vein occlusion; CRVO central retinal vein occlusion; CRAO central retinal artery occlusion; BRAO branch retinal artery occlusion; RT retinal tear; SB scleral buckle; PPV pars plana vitrectomy; VH Vitreous hemorrhage; PRP panretinal laser photocoagulation; IVK intravitreal kenalog; VMT vitreomacular traction; MH Macular hole;  NVD neovascularization of the disc; NVE neovascularization elsewhere; AREDS age related eye disease study; ARMD age related macular degeneration; POAG primary open angle glaucoma; EBMD epithelial/anterior basement membrane dystrophy; ACIOL anterior chamber intraocular lens; IOL intraocular lens; PCIOL posterior chamber intraocular lens; Phaco/IOL phacoemulsification with intraocular lens placement; PRK photorefractive keratectomy; LASIK laser assisted in situ keratomileusis; HTN hypertension; DM diabetes mellitus; COPD chronic obstructive pulmonary disease

## 2022-05-31 NOTE — Assessment & Plan Note (Signed)
OS with demonstrable worsening of posterior pole geographic atrophy over the last 5 to 6 years by color fundus photography by red free and autofluorescence.  Likely candidate for Syfovre next visit

## 2022-10-31 ENCOUNTER — Encounter (INDEPENDENT_AMBULATORY_CARE_PROVIDER_SITE_OTHER): Payer: Medicare HMO | Admitting: Ophthalmology

## 2022-12-26 DEATH — deceased
